# Patient Record
Sex: Male | Born: 1990 | Race: White | Hispanic: No | Marital: Married | Smoking: Former smoker
Health system: Southern US, Community
[De-identification: ages and names within clinical notes are randomized; demographics above are authoritative.]

---
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
## 2018-02-02 ENCOUNTER — Encounter (HOSPITAL_COMMUNITY): Payer: Self-pay

## 2018-02-02 ENCOUNTER — Emergency Department (HOSPITAL_COMMUNITY): Payer: BLUE CROSS/BLUE SHIELD

## 2018-02-02 ENCOUNTER — Emergency Department (HOSPITAL_COMMUNITY)
Admission: EM | Admit: 2018-02-02 | Discharge: 2018-02-02 | Disposition: A | Discharge status: Home / Self Care | Payer: BLUE CROSS/BLUE SHIELD | Attending: Emergency Medicine | Admitting: Emergency Medicine

## 2018-02-02 ENCOUNTER — Other Ambulatory Visit: Payer: Self-pay

## 2018-02-02 DIAGNOSIS — Y9355 Activity, bike riding: Secondary | ICD-10-CM | POA: Insufficient documentation

## 2018-02-02 DIAGNOSIS — R51 Headache: Secondary | ICD-10-CM | POA: Insufficient documentation

## 2018-02-02 DIAGNOSIS — Y999 Unspecified external cause status: Secondary | ICD-10-CM | POA: Diagnosis not present

## 2018-02-02 DIAGNOSIS — K029 Dental caries, unspecified: Secondary | ICD-10-CM | POA: Insufficient documentation

## 2018-02-02 DIAGNOSIS — S0081XA Abrasion of other part of head, initial encounter: Secondary | ICD-10-CM | POA: Diagnosis not present

## 2018-02-02 DIAGNOSIS — Y929 Unspecified place or not applicable: Secondary | ICD-10-CM | POA: Diagnosis not present

## 2018-02-02 DIAGNOSIS — S0240EA Zygomatic fracture, right side, initial encounter for closed fracture: Secondary | ICD-10-CM | POA: Diagnosis not present

## 2018-02-02 DIAGNOSIS — S0993XA Unspecified injury of face, initial encounter: Secondary | ICD-10-CM | POA: Diagnosis present

## 2018-02-02 MED ORDER — ACETAMINOPHEN 325 MG PO TABS
650.0000 mg | ORAL_TABLET | Freq: Once | ORAL | Status: AC
  Administered 2018-02-02: 650 mg via ORAL
  Filled 2018-02-02: qty 2

## 2018-02-02 NOTE — ED Notes (Signed)
Patient transported to CT 

## 2018-02-02 NOTE — ED Notes (Signed)
Patient verbalizes understanding of discharge instructions. Opportunity for questioning and answers were provided. Armband removed by staff, pt discharged from ED.  
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
## 2018-02-02 NOTE — Discharge Instructions (Addendum)
I have provided a referral to Dr. Ulice Boldillingham please call tomorrow am to schedule an appointment. You may apply ice to the area along with take tylenol or ibuprofen for your pain. If your symptoms worsen you may return to the ED.
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
## 2018-02-03 ENCOUNTER — Ambulatory Visit (HOSPITAL_BASED_OUTPATIENT_CLINIC_OR_DEPARTMENT_OTHER): Payer: BLUE CROSS/BLUE SHIELD | Admitting: Anesthesiology

## 2018-02-03 ENCOUNTER — Ambulatory Visit (HOSPITAL_BASED_OUTPATIENT_CLINIC_OR_DEPARTMENT_OTHER)
Admission: RE | Admit: 2018-02-03 | Discharge: 2018-02-03 | Disposition: A | Discharge status: Home / Self Care | Payer: BLUE CROSS/BLUE SHIELD | Source: Ambulatory Visit | Attending: Plastic Surgery | Admitting: Plastic Surgery

## 2018-02-03 ENCOUNTER — Encounter (HOSPITAL_BASED_OUTPATIENT_CLINIC_OR_DEPARTMENT_OTHER)
Admission: RE | Disposition: A | Discharge status: Home / Self Care | Payer: Self-pay | Source: Ambulatory Visit | Attending: Plastic Surgery

## 2018-02-03 ENCOUNTER — Encounter (HOSPITAL_BASED_OUTPATIENT_CLINIC_OR_DEPARTMENT_OTHER): Payer: Self-pay

## 2018-02-03 ENCOUNTER — Encounter: Payer: Self-pay | Admitting: Plastic Surgery

## 2018-02-03 ENCOUNTER — Ambulatory Visit (INDEPENDENT_AMBULATORY_CARE_PROVIDER_SITE_OTHER): Payer: BLUE CROSS/BLUE SHIELD | Admitting: Plastic Surgery

## 2018-02-03 ENCOUNTER — Other Ambulatory Visit: Payer: Self-pay

## 2018-02-03 VITALS — BP 118/75 | HR 92 | Resp 14 | Ht 68.0 in | Wt 140.0 lb

## 2018-02-03 DIAGNOSIS — S0240EA Zygomatic fracture, right side, initial encounter for closed fracture: Secondary | ICD-10-CM | POA: Diagnosis not present

## 2018-02-03 DIAGNOSIS — W228XXA Striking against or struck by other objects, initial encounter: Secondary | ICD-10-CM | POA: Diagnosis not present

## 2018-02-03 DIAGNOSIS — Z87891 Personal history of nicotine dependence: Secondary | ICD-10-CM | POA: Insufficient documentation

## 2018-02-03 HISTORY — PX: CLOSED REDUCTION ZYGOMATIC ARCH FRACTURE: SHX1362

## 2018-02-03 SURGERY — CLOSED REDUCTION ZYGOMATIC
Anesthesia: General | Site: Face | Laterality: Right

## 2018-02-03 MED ORDER — ONDANSETRON HCL 4 MG/2ML IJ SOLN
INTRAMUSCULAR | Status: AC
  Filled 2018-02-03: qty 2

## 2018-02-03 MED ORDER — LIDOCAINE-EPINEPHRINE 1 %-1:100000 IJ SOLN
INTRAMUSCULAR | Status: DC | PRN
  Administered 2018-02-03: 1 mL

## 2018-02-03 MED ORDER — SODIUM CHLORIDE 0.9% FLUSH: 3.0000 mL | INTRAVENOUS | Status: DC | PRN

## 2018-02-03 MED ORDER — PROPOFOL 10 MG/ML IV BOLUS
INTRAVENOUS | Status: DC | PRN
  Administered 2018-02-03: 200 mg via INTRAVENOUS
  Administered 2018-02-03 (×2): 50 mg via INTRAVENOUS

## 2018-02-03 MED ORDER — HYDROMORPHONE HCL 1 MG/ML IJ SOLN: 0.2500 mg | INTRAMUSCULAR | Status: DC | PRN

## 2018-02-03 MED ORDER — MIDAZOLAM HCL 2 MG/2ML IJ SOLN
1.0000 mg | INTRAMUSCULAR | Status: DC | PRN
  Administered 2018-02-03: 2 mg via INTRAVENOUS

## 2018-02-03 MED ORDER — FENTANYL CITRATE (PF) 100 MCG/2ML IJ SOLN
INTRAMUSCULAR | Status: AC
  Filled 2018-02-03: qty 2

## 2018-02-03 MED ORDER — HYDROCODONE-ACETAMINOPHEN 5-325 MG PO TABS: 1.0000 | ORAL_TABLET | Freq: Four times a day (QID) | ORAL | 0 refills | Status: DC | PRN

## 2018-02-03 MED ORDER — SUCCINYLCHOLINE CHLORIDE 20 MG/ML IJ SOLN
INTRAMUSCULAR | Status: DC | PRN
  Administered 2018-02-03: 80 mg via INTRAVENOUS

## 2018-02-03 MED ORDER — SODIUM CHLORIDE 0.9% FLUSH: 3.0000 mL | Freq: Two times a day (BID) | INTRAVENOUS | Status: DC

## 2018-02-03 MED ORDER — PROPOFOL 10 MG/ML IV BOLUS
INTRAVENOUS | Status: AC
  Filled 2018-02-03: qty 20

## 2018-02-03 MED ORDER — ONDANSETRON HCL 4 MG PO TABS: 4.0000 mg | ORAL_TABLET | Freq: Two times a day (BID) | ORAL | 0 refills | Status: DC

## 2018-02-03 MED ORDER — CEFAZOLIN SODIUM-DEXTROSE 2-4 GM/100ML-% IV SOLN
2.0000 g | INTRAVENOUS | Status: AC
  Administered 2018-02-03: 2 g via INTRAVENOUS

## 2018-02-03 MED ORDER — SODIUM CHLORIDE 0.9 % IV SOLN: 250.0000 mL | INTRAVENOUS | Status: DC | PRN

## 2018-02-03 MED ORDER — ACETAMINOPHEN 325 MG PO TABS: 650.0000 mg | ORAL_TABLET | ORAL | Status: DC | PRN

## 2018-02-03 MED ORDER — PROMETHAZINE HCL 25 MG/ML IJ SOLN: 6.2500 mg | INTRAMUSCULAR | Status: DC | PRN

## 2018-02-03 MED ORDER — SCOPOLAMINE 1 MG/3DAYS TD PT72: 1.0000 | MEDICATED_PATCH | Freq: Once | TRANSDERMAL | Status: DC | PRN

## 2018-02-03 MED ORDER — MEPERIDINE HCL 25 MG/ML IJ SOLN: 6.2500 mg | INTRAMUSCULAR | Status: DC | PRN

## 2018-02-03 MED ORDER — LIDOCAINE-EPINEPHRINE 1 %-1:100000 IJ SOLN
INTRAMUSCULAR | Status: AC
  Filled 2018-02-03: qty 1

## 2018-02-03 MED ORDER — LIDOCAINE 2% (20 MG/ML) 5 ML SYRINGE
INTRAMUSCULAR | Status: AC
  Filled 2018-02-03: qty 5

## 2018-02-03 MED ORDER — ACETAMINOPHEN 650 MG RE SUPP: 650.0000 mg | RECTAL | Status: DC | PRN

## 2018-02-03 MED ORDER — LIDOCAINE HCL (CARDIAC) PF 100 MG/5ML IV SOSY
PREFILLED_SYRINGE | INTRAVENOUS | Status: DC | PRN
  Administered 2018-02-03: 50 mg via INTRAVENOUS

## 2018-02-03 MED ORDER — BSS IO SOLN
INTRAOCULAR | Status: AC
  Filled 2018-02-03: qty 15

## 2018-02-03 MED ORDER — OXYMETAZOLINE HCL 0.05 % NA SOLN
NASAL | Status: AC
  Filled 2018-02-03: qty 15

## 2018-02-03 MED ORDER — HYDROCODONE-ACETAMINOPHEN 5-325 MG PO TABS: 1.0000 | ORAL_TABLET | Freq: Four times a day (QID) | ORAL | 0 refills | Status: AC | PRN

## 2018-02-03 MED ORDER — ONDANSETRON HCL 4 MG PO TABS: 4.0000 mg | ORAL_TABLET | Freq: Two times a day (BID) | ORAL | 0 refills | Status: AC

## 2018-02-03 MED ORDER — LACTATED RINGERS IV SOLN
INTRAVENOUS | Status: DC
  Administered 2018-02-03: 11:00:00 via INTRAVENOUS

## 2018-02-03 MED ORDER — DEXAMETHASONE SODIUM PHOSPHATE 10 MG/ML IJ SOLN
INTRAMUSCULAR | Status: AC
  Filled 2018-02-03: qty 1

## 2018-02-03 MED ORDER — OXYCODONE HCL 5 MG PO TABS: 5.0000 mg | ORAL_TABLET | ORAL | Status: DC | PRN

## 2018-02-03 MED ORDER — DEXAMETHASONE SODIUM PHOSPHATE 4 MG/ML IJ SOLN
INTRAMUSCULAR | Status: DC | PRN
  Administered 2018-02-03: 10 mg via INTRAVENOUS

## 2018-02-03 MED ORDER — MIDAZOLAM HCL 2 MG/2ML IJ SOLN
INTRAMUSCULAR | Status: AC
  Filled 2018-02-03: qty 2

## 2018-02-03 MED ORDER — ONDANSETRON HCL 4 MG/2ML IJ SOLN
INTRAMUSCULAR | Status: DC | PRN
  Administered 2018-02-03: 4 mg via INTRAVENOUS

## 2018-02-03 MED ORDER — CEFAZOLIN SODIUM 1 G IJ SOLR
INTRAMUSCULAR | Status: AC
  Filled 2018-02-03: qty 20

## 2018-02-03 MED ORDER — FENTANYL CITRATE (PF) 100 MCG/2ML IJ SOLN
50.0000 ug | INTRAMUSCULAR | Status: DC | PRN
  Administered 2018-02-03: 100 ug via INTRAVENOUS

## 2018-02-03 MED ORDER — OXYCODONE HCL 5 MG PO TABS: 5.0000 mg | ORAL_TABLET | Freq: Once | ORAL | Status: DC | PRN

## 2018-02-03 MED ORDER — OXYCODONE HCL 5 MG/5ML PO SOLN: 5.0000 mg | Freq: Once | ORAL | Status: DC | PRN

## 2018-02-03 MED ORDER — AMOXICILLIN-POT CLAVULANATE 500-125 MG PO TABS: 1.0000 | ORAL_TABLET | Freq: Three times a day (TID) | ORAL | 0 refills | Status: AC

## 2018-02-03 SURGICAL SUPPLY — 35 items
ADH SKN CLS APL DERMABOND .7 (GAUZE/BANDAGES/DRESSINGS)
APL SRG 3 HI ABS STRL LF PLS (MISCELLANEOUS)
APPLICATOR DR MATTHEWS STRL (MISCELLANEOUS) IMPLANT
CANISTER SUCT 1200ML W/VALVE (MISCELLANEOUS) IMPLANT
COVER WAND RF STERILE (DRAPES) IMPLANT
DECANTER SPIKE VIAL GLASS SM (MISCELLANEOUS) ×3 IMPLANT
DERMABOND ADVANCED (GAUZE/BANDAGES/DRESSINGS)
DERMABOND ADVANCED .7 DNX12 (GAUZE/BANDAGES/DRESSINGS) IMPLANT
ELECT NDL BLADE 2-5/6 (NEEDLE) ×1 IMPLANT
ELECT NEEDLE BLADE 2-5/6 (NEEDLE) ×3 IMPLANT
ELECT REM PT RETURN 9FT ADLT (ELECTROSURGICAL) ×3
ELECTRODE REM PT RTRN 9FT ADLT (ELECTROSURGICAL) ×1 IMPLANT
GLOVE BIO SURGEON STRL SZ 6.5 (GLOVE) ×2 IMPLANT
GLOVE BIO SURGEONS STRL SZ 6.5 (GLOVE)
GLOVE SURG SS PI 6.5 STRL IVOR (GLOVE) ×6 IMPLANT
GOWN STRL REUS W/ TWL LRG LVL3 (GOWN DISPOSABLE) ×1 IMPLANT
GOWN STRL REUS W/TWL LRG LVL3 (GOWN DISPOSABLE) ×3
NDL HYPO 30GX1 BEV (NEEDLE) ×1 IMPLANT
NEEDLE HYPO 30GX1 BEV (NEEDLE) ×3 IMPLANT
NS IRRIG 1000ML POUR BTL (IV SOLUTION) ×3 IMPLANT
PACK BASIN DAY SURGERY FS (CUSTOM PROCEDURE TRAY) ×3 IMPLANT
PACK ENT DAY SURGERY (CUSTOM PROCEDURE TRAY) ×3 IMPLANT
PATTIES SURGICAL .5 X3 (DISPOSABLE) IMPLANT
PENCIL BUTTON HOLSTER BLD 10FT (ELECTRODE) ×3 IMPLANT
SHEILD EYE MED CORNL SHD 22X21 (OPHTHALMIC RELATED) ×3
SHIELD EYE MED CORNL SHD 22X21 (OPHTHALMIC RELATED) ×1 IMPLANT
SUT MNCRL 6-0 UNDY P1 1X18 (SUTURE) IMPLANT
SUT MONOCRYL 6-0 P1 1X18 (SUTURE) ×2
SUT PROLENE 6 0 P 1 18 (SUTURE) IMPLANT
SUT SILK 6 0 P 1 (SUTURE) ×1 IMPLANT
SUT VIC AB 5-0 P-3 18X BRD (SUTURE) IMPLANT
SUT VIC AB 5-0 P3 18 (SUTURE)
SYR BULB 3OZ (MISCELLANEOUS) IMPLANT
TOWEL GREEN STERILE FF (TOWEL DISPOSABLE) ×3 IMPLANT
TRAY DSU PREP LF (CUSTOM PROCEDURE TRAY) ×3 IMPLANT
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
## 2018-02-03 NOTE — Interval H&P Note (Signed)
History and Physical Interval Note:  02/03/2018 11:11 AM  Corey Vaughn  has presented today for surgery, with the diagnosis of Fracture Right Zygomatic Arch  The various methods of treatment have been discussed with the patient and family. After consideration of risks, benefits and other options for treatment, the patient has consented to  Procedure(s): CLOSED REDUCTION ZYGOMATIC (Right) as a surgical intervention .  The patient's history has been reviewed, patient examined, no change in status, stable for surgery.  I have reviewed the patient's chart and labs.  Questions were answered to the patient's satisfaction.     Alena Billslaire S Koby Pickup

## 2018-02-03 NOTE — Anesthesia Preprocedure Evaluation (Addendum)
Anesthesia Evaluation  Patient identified by MRN, date of birth, ID band Patient awake    Reviewed: Allergy & Precautions, NPO status , Patient's Chart, lab work & pertinent test results  Airway Mallampati: II  TM Distance: >3 FB Neck ROM: Full    Dental  (+) Dental Advisory Given, Upper Dentures, Missing   Pulmonary neg pulmonary ROS, former smoker,    Pulmonary exam normal breath sounds clear to auscultation       Cardiovascular negative cardio ROS Normal cardiovascular exam Rhythm:Regular Rate:Normal     Neuro/Psych negative neurological ROS  negative psych ROS   GI/Hepatic negative GI ROS, Neg liver ROS,   Endo/Other  negative endocrine ROS  Renal/GU negative Renal ROS     Musculoskeletal negative musculoskeletal ROS (+)   Abdominal   Peds  Hematology negative hematology ROS (+)   Anesthesia Other Findings   Reproductive/Obstetrics                            Anesthesia Physical Anesthesia Plan  ASA: II  Anesthesia Plan: General   Post-op Pain Management:    Induction: Intravenous  PONV Risk Score and Plan: 2 and Ondansetron, Dexamethasone, Midazolam and Treatment may vary due to age or medical condition  Airway Management Planned: LMA and Oral ETT  Additional Equipment:   Intra-op Plan:   Post-operative Plan: Extubation in OR  Informed Consent: I have reviewed the patients History and Physical, chart, labs and discussed the procedure including the risks, benefits and alternatives for the proposed anesthesia with the patient or authorized representative who has indicated his/her understanding and acceptance.   Dental advisory given  Plan Discussed with: CRNA  Anesthesia Plan Comments:         Anesthesia Quick Evaluation

## 2018-02-03 NOTE — Anesthesia Procedure Notes (Signed)
Procedure Name: Intubation Date/Time: 02/03/2018 12:37 PM Performed by: Montez Moritaarter, Frona Yost W, CRNA Pre-anesthesia Checklist: Patient identified, Emergency Drugs available, Suction available and Patient being monitored Patient Re-evaluated:Patient Re-evaluated prior to induction Oxygen Delivery Method: Circle system utilized Preoxygenation: Pre-oxygenation with 100% oxygen Induction Type: IV induction Ventilation: Mask ventilation without difficulty Laryngoscope Size: Miller and 2 Grade View: Grade I Tube type: Oral Tube size: 7.0 mm Number of attempts: 1 Airway Equipment and Method: Stylet and Oral airway Placement Confirmation: ETT inserted through vocal cords under direct vision,  positive ETCO2 and breath sounds checked- equal and bilateral Secured at: 24 cm Tube secured with: Tape Dental Injury: Teeth and Oropharynx as per pre-operative assessment

## 2018-02-03 NOTE — Op Note (Addendum)
DATE OF OPERATION: 02/03/2018  [facility]  PREOPERATIVE DIAGNOSIS: right zygomatic arch fracture  POSTOPERATIVE DIAGNOSIS: Same  PROCEDURE: reduction of right zygomatic arch fracture.  SURGEON: Samani Deal Sanger Mry Lamia, DO  ASSISTANT: Anette Riedelarmen Mayo, PA  EBL: none  CONDITION: Stable  COMPLICATIONS: None  INDICATION: The patient, Corey Vaughn, is a 27 y.o. male born on 12/15/1990, is here for treatment of a right zygomatic arch fracture.   PROCEDURE DETAILS:  The patient was seen prior to surgery and marked.  The IV antibiotics were given. The patient was taken to the operating room and given a general anesthetic. A standard time out was performed and all information was confirmed by those in the room. SCDs were placed.  The patient was prepped and draped in a sterile fashion.  Local was injected into the skin of the right temple.   We waited several minutes for the local to take effect.  Using the WhatleyGillies approach a #15 blade was used to make an 1 cm incision at the temporal area anterior and superior to the root of the helix.  The plane was followed to just deep to the superficial layer of the deep temporal fascia.  An elevator was inserted through the incision to slide under the zygomatic arch.  This was used to lift the fractures into the anatomic position.  Hemostasis was achieved with the bovie.   A 5-0 Monocryl was used to close the fascia and then skin with simple interrupted sutures.  there was good facial symmetry of the zygomas. The advanced practice practitioner (APP) assisted throughout the case.  The APP was essential in retraction and counter traction when needed to make the case progress smoothly.  This retraction and assistance made it possible to see the tissue plans for the procedure. The patient was allowed to wake up and taken to recovery room in stable condition at the end of the case. The family was notified at the end of the case.

## 2018-02-03 NOTE — Transfer of Care (Signed)
Immediate Anesthesia Transfer of Care Note  Patient: Corey Vaughn  Procedure(s) Performed: CLOSED REDUCTION ZYGOMATIC (Right Face)  Patient Location: PACU  Anesthesia Type:General  Level of Consciousness: awake  Airway & Oxygen Therapy: Patient Spontanous Breathing and Patient connected to face mask oxygen  Post-op Assessment: Report given to RN and Post -op Vital signs reviewed and stable  Post vital signs: Reviewed and stable  Last Vitals:  Vitals Value Taken Time  BP 108/71 02/03/2018  1:18 PM  Temp    Pulse 79 02/03/2018  1:18 PM  Resp 13 02/03/2018  1:18 PM  SpO2 100 % 02/03/2018  1:18 PM  Vitals shown include unvalidated device data.  Last Pain:  Vitals:   02/03/18 1055  TempSrc: Oral  PainSc: 0-No pain         Complications: No apparent anesthesia complications

## 2018-02-03 NOTE — Discharge Instructions (Signed)
Keep head elevated  May shower tomorrow May return to work 02/05/18 No bicycle stunts Liquid/soft food diet for 1 week    Call your surgeon if you experience:   1.  Fever over 101.0. 2.  Inability to urinate. 3.  Nausea and/or vomiting. 4.  Extreme swelling or bruising at the surgical site. 5.  Continued bleeding from the incision. 6.  Increased pain, redness or drainage from the incision. 7.  Problems related to your pain medication. 8.  Any problems and/or concerns    Post Anesthesia Home Care Instructions  Activity: Get plenty of rest for the remainder of the day. A responsible individual must stay with you for 24 hours following the procedure.  For the next 24 hours, DO NOT: -Drive a car -Advertising copywriterperate machinery -Drink alcoholic beverages -Take any medication unless instructed by your physician -Make any legal decisions or sign important papers.  Meals: Start with liquid foods such as gelatin or soup. Progress to regular foods as tolerated. Avoid greasy, spicy, heavy foods. If nausea and/or vomiting occur, drink only clear liquids until the nausea and/or vomiting subsides. Call your physician if vomiting continues.  Special Instructions/Symptoms: Your throat may feel dry or sore from the anesthesia or the breathing tube placed in your throat during surgery. If this causes discomfort, gargle with warm salt water. The discomfort should disappear within 24 hours.  If you had a scopolamine patch placed behind your ear for the management of post- operative nausea and/or vomiting:  1. The medication in the patch is effective for 72 hours, after which it should be removed.  Wrap patch in a tissue and discard in the trash. Wash hands thoroughly with soap and water. 2. You may remove the patch earlier than 72 hours if you experience unpleasant side effects which may include dry mouth, dizziness or visual disturbances. 3. Avoid touching the patch. Wash your hands with soap and water  after contact with the patch.

## 2018-02-04 ENCOUNTER — Encounter (HOSPITAL_BASED_OUTPATIENT_CLINIC_OR_DEPARTMENT_OTHER): Payer: Self-pay | Admitting: Plastic Surgery

## 2018-02-04 NOTE — Anesthesia Postprocedure Evaluation (Signed)
Anesthesia Post Note  Patient: Corey Vaughn  Procedure(s) Performed: CLOSED REDUCTION ZYGOMATIC (Right Face)     Anesthesia Post Evaluation  Last Vitals:  Vitals:   02/03/18 1345 02/03/18 1411  BP: 110/73 106/72  Pulse: 70 70  Resp: 13 16  Temp:  36.4 C  SpO2: 98% 98%    Last Pain:  Vitals:   02/03/18 1411  TempSrc: Oral  PainSc: 2                  Corey Vaughn

## 2018-02-21 ENCOUNTER — Encounter: Payer: Self-pay | Admitting: Plastic Surgery

## 2018-02-21 ENCOUNTER — Ambulatory Visit (INDEPENDENT_AMBULATORY_CARE_PROVIDER_SITE_OTHER): Payer: BLUE CROSS/BLUE SHIELD | Admitting: Plastic Surgery

## 2018-02-21 VITALS — BP 120/80 | HR 78 | Temp 98.3°F | Ht 68.0 in | Wt 142.0 lb

## 2018-02-21 DIAGNOSIS — S0240EA Zygomatic fracture, right side, initial encounter for closed fracture: Secondary | ICD-10-CM
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
## 2018-03-02 ENCOUNTER — Emergency Department (HOSPITAL_COMMUNITY): Payer: BLUE CROSS/BLUE SHIELD

## 2018-03-02 ENCOUNTER — Encounter (HOSPITAL_COMMUNITY): Payer: Self-pay | Admitting: Emergency Medicine

## 2018-03-02 ENCOUNTER — Other Ambulatory Visit: Payer: Self-pay

## 2018-03-02 ENCOUNTER — Emergency Department (HOSPITAL_COMMUNITY)
Admission: EM | Admit: 2018-03-02 | Discharge: 2018-03-02 | Disposition: A | Discharge status: Home / Self Care | Payer: BLUE CROSS/BLUE SHIELD | Attending: Emergency Medicine | Admitting: Emergency Medicine

## 2018-03-02 DIAGNOSIS — S0990XA Unspecified injury of head, initial encounter: Secondary | ICD-10-CM | POA: Insufficient documentation

## 2018-03-02 DIAGNOSIS — Y939 Activity, unspecified: Secondary | ICD-10-CM | POA: Diagnosis not present

## 2018-03-02 DIAGNOSIS — Z87891 Personal history of nicotine dependence: Secondary | ICD-10-CM | POA: Insufficient documentation

## 2018-03-02 DIAGNOSIS — Z91040 Latex allergy status: Secondary | ICD-10-CM | POA: Diagnosis not present

## 2018-03-02 DIAGNOSIS — Y999 Unspecified external cause status: Secondary | ICD-10-CM | POA: Diagnosis not present

## 2018-03-02 DIAGNOSIS — X58XXXA Exposure to other specified factors, initial encounter: Secondary | ICD-10-CM | POA: Insufficient documentation

## 2018-03-02 DIAGNOSIS — Z79899 Other long term (current) drug therapy: Secondary | ICD-10-CM | POA: Diagnosis not present

## 2018-03-02 DIAGNOSIS — Y929 Unspecified place or not applicable: Secondary | ICD-10-CM | POA: Diagnosis not present

## 2018-03-02 DIAGNOSIS — S0990XD Unspecified injury of head, subsequent encounter: Secondary | ICD-10-CM
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
## 2018-03-02 NOTE — ED Notes (Signed)
Patient verbalizes understanding of discharge instructions. Opportunity for questioning and answers were provided. Armband removed by staff, pt discharged from ED ambulatory.   

## 2018-03-02 NOTE — Discharge Instructions (Addendum)
A concussion is a very mild traumatic brain injury caused by a bump, jolt or blow to the head, most people recover quickly and fully. You can experience a wide variety of symptoms including:   - Confusion      - Difficulty concentrating       - Trouble remembering new info  - Headache      - Dizziness        - Fuzzy or blurry vision  - Fatigue      - Balance problems      - Light sensitivity  - Mood swings     - Changes in sleep or difficulty sleeping   To help these symptoms improve make sure you are getting plenty of rest, avoid screen time, loud music and strenuous mental activities. Avoid any strenuous physical activities, once your symptoms have resolved a slow and gradual return to activity is recommended. It is very important that you avoid situations in which you might sustain a second head injury as this can be very dangerous and life threatening. You cannot be medically cleared to return to normal activities until you have followed up with your primary doctor or a concussion specialist for reevaluation.  

## 2018-03-02 NOTE — ED Notes (Signed)
Pt observed to be ambulatory to the bathroom with steady gait and no noted difficulty.
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
## 2018-03-02 NOTE — ED Notes (Addendum)
Patient transported to CT 

## 2018-08-23 ENCOUNTER — Emergency Department
Admission: EM | Admit: 2018-08-23 | Discharge: 2018-08-23 | Disposition: A | Discharge status: Home / Self Care | Payer: BLUE CROSS/BLUE SHIELD | Attending: Emergency Medicine | Admitting: Emergency Medicine

## 2018-08-23 ENCOUNTER — Other Ambulatory Visit: Payer: Self-pay

## 2018-08-23 DIAGNOSIS — Z5321 Procedure and treatment not carried out due to patient leaving prior to being seen by health care provider: Secondary | ICD-10-CM | POA: Insufficient documentation

## 2018-08-23 DIAGNOSIS — J029 Acute pharyngitis, unspecified: Secondary | ICD-10-CM | POA: Insufficient documentation

## 2018-08-23 LAB — GROUP A STREP BY PCR: Group A Strep by PCR: NOT DETECTED

## 2018-08-23 NOTE — ED Notes (Signed)
Pt states that he wants to go home.

## 2018-08-23 NOTE — ED Triage Notes (Addendum)
Pt complains of sore throat since yesterday. Pt states "it hurts to swallow". Pt with red, nonswollen tonsils noted. Appears in no acute distress.
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
## 2021-12-11 ENCOUNTER — Other Ambulatory Visit: Payer: Self-pay

## 2021-12-11 ENCOUNTER — Encounter (HOSPITAL_BASED_OUTPATIENT_CLINIC_OR_DEPARTMENT_OTHER): Payer: Self-pay

## 2021-12-11 ENCOUNTER — Emergency Department (HOSPITAL_BASED_OUTPATIENT_CLINIC_OR_DEPARTMENT_OTHER): Payer: Self-pay | Admitting: Radiology

## 2021-12-11 DIAGNOSIS — S8001XA Contusion of right knee, initial encounter: Secondary | ICD-10-CM | POA: Insufficient documentation

## 2021-12-11 DIAGNOSIS — X58XXXA Exposure to other specified factors, initial encounter: Secondary | ICD-10-CM | POA: Insufficient documentation

## 2021-12-11 DIAGNOSIS — Z23 Encounter for immunization: Secondary | ICD-10-CM | POA: Insufficient documentation
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
## 2021-12-12 ENCOUNTER — Emergency Department (HOSPITAL_BASED_OUTPATIENT_CLINIC_OR_DEPARTMENT_OTHER): Payer: Self-pay | Admitting: Radiology

## 2021-12-12 ENCOUNTER — Emergency Department (HOSPITAL_BASED_OUTPATIENT_CLINIC_OR_DEPARTMENT_OTHER)
Admission: EM | Admit: 2021-12-12 | Discharge: 2021-12-12 | Disposition: A | Discharge status: Home / Self Care | Payer: Self-pay | Attending: Emergency Medicine | Admitting: Emergency Medicine

## 2021-12-12 DIAGNOSIS — S8001XA Contusion of right knee, initial encounter: Secondary | ICD-10-CM

## 2021-12-12 MED ORDER — HYDROCODONE-ACETAMINOPHEN 5-325 MG PO TABS
1.0000 | ORAL_TABLET | Freq: Once | ORAL | Status: AC
  Administered 2021-12-12: 1 via ORAL
  Filled 2021-12-12: qty 1

## 2021-12-12 MED ORDER — TETANUS-DIPHTH-ACELL PERTUSSIS 5-2.5-18.5 LF-MCG/0.5 IM SUSY
0.5000 mL | PREFILLED_SYRINGE | Freq: Once | INTRAMUSCULAR | Status: AC
  Administered 2021-12-12: 0.5 mL via INTRAMUSCULAR
  Filled 2021-12-12: qty 0.5

## 2021-12-12 MED ORDER — IBUPROFEN 400 MG PO TABS
400.0000 mg | ORAL_TABLET | Freq: Once | ORAL | Status: AC
  Administered 2021-12-12: 400 mg via ORAL
  Filled 2021-12-12: qty 1

## 2021-12-12 NOTE — Discharge Instructions (Signed)
Keep the leg elevated.  Please ice your knee frequently.  Use the crutches for the next several days. If you have increasing pain, swelling, weakness or numbness in the right foot please return to the ER
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
## 2021-12-12 NOTE — ED Notes (Signed)
Pt verbalizes understanding of discharge instructions. Opportunity for questioning and answers were provided. Pt discharged from ED to home with significant other.   ? ?
